# Patient Record
Sex: Male | Born: 1940 | Race: Black or African American | Hispanic: No | Marital: Married | Smoking: Former smoker
Health system: Southern US, Community
[De-identification: ages and names within clinical notes are randomized; demographics above are authoritative.]

## PROBLEM LIST (undated history)

## (undated) DIAGNOSIS — C419 Malignant neoplasm of bone and articular cartilage, unspecified: Secondary | ICD-10-CM

## (undated) DIAGNOSIS — H409 Unspecified glaucoma: Secondary | ICD-10-CM

## (undated) DIAGNOSIS — C801 Malignant (primary) neoplasm, unspecified: Secondary | ICD-10-CM

## (undated) DIAGNOSIS — I2510 Atherosclerotic heart disease of native coronary artery without angina pectoris: Secondary | ICD-10-CM

## (undated) DIAGNOSIS — C3490 Malignant neoplasm of unspecified part of unspecified bronchus or lung: Secondary | ICD-10-CM

## (undated) DIAGNOSIS — E785 Hyperlipidemia, unspecified: Secondary | ICD-10-CM

## (undated) HISTORY — PX: NO PAST SURGERIES: SHX2092

## (undated) HISTORY — DX: Hyperlipidemia, unspecified: E78.5

## (undated) HISTORY — DX: Atherosclerotic heart disease of native coronary artery without angina pectoris: I25.10

## (undated) HISTORY — DX: Unspecified glaucoma: H40.9

## (undated) HISTORY — DX: Malignant (primary) neoplasm, unspecified: C80.1

---

## 2014-01-10 ENCOUNTER — Ambulatory Visit: Payer: Self-pay | Admitting: Family Medicine

## 2014-01-10 VITALS — BP 122/80 | HR 55 | Temp 98.6°F | Resp 17 | Ht 66.0 in | Wt 163.0 lb

## 2014-01-10 DIAGNOSIS — Z0000 Encounter for general adult medical examination without abnormal findings: Secondary | ICD-10-CM

## 2014-01-10 NOTE — Progress Notes (Signed)
[facility] [address] [place] [address] [phone]  Date:  01/10/2014   Name:  Nicholas Frey   DOB:  12-19-1940   MRN:  [id number]  PCP:  No primary provider on file.    Chief Complaint: Employment Physical   History of Present Illness:  Nicholas Frey is a 73 y.o. very pleasant male patient who presents with the following:  Here today for a DOT exam.  He is generally very healthy- he has no known medical problems and only takes a baby asprin daily; no other meds  There are no active problems to display for this patient.   History reviewed. No pertinent past medical history.  History reviewed. No pertinent past surgical history.  History  Substance Use Topics  . Smoking status: Current Every Day Smoker -- 0.40 packs/day for 50 years    Types: Cigarettes  . Smokeless tobacco: Not on file  . Alcohol Use: No    History reviewed. No pertinent family history.  No Known Allergies  Medication list has been reviewed and updated.  No current outpatient prescriptions on file prior to visit.   No current facility-administered medications on file prior to visit.    Review of Systems:  As per HPI- otherwise negative. States that his pulse has always been a bit on the slow side "all my life."     Physical Examination: Filed Vitals:   01/10/14 1252  BP: 122/80  Pulse: 55  Temp: 98.6 F (37 C)  Resp: 17   Filed Vitals:   01/10/14 1252  Height: 5\' 6"  (1.676 m)  Weight: 163 lb (73.936 kg)   Body mass index is 26.32 kg/(m^2). Ideal Body Weight: Weight in (lb) to have BMI = 25: 154.6  GEN: WDWN, NAD, Non-toxic, A & O x 3 HEENT: Atraumatic, Normocephalic. Neck supple. No masses, No LAD.  Bilateral TM wnl, oropharynx normal.  PEERL,EOMI.  Ears and Nose: No external deformity. CV: RRR, No M/G/R. No JVD. No thrill. No extra heart sounds. PULM: CTA B, no wheezes, crackles, rhonchi. No retractions. No resp. distress. No accessory muscle  use. ABD: S, NT, ND. No rebound. No HSM. EXTR: No c/c/e NEURO Normal gait. Normal strength and DTR all extremities  PSYCH: Normally interactive. Conversant. Not depressed or anxious appearing.  Calm demeanor.  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  He is extremely healthy for his age.   Signed Lamar Blinks, MD

## 2014-01-10 NOTE — Patient Instructions (Signed)
You qualify for a 2 year DOT card- take care!

## 2014-02-15 ENCOUNTER — Emergency Department (HOSPITAL_BASED_OUTPATIENT_CLINIC_OR_DEPARTMENT_OTHER)
Admission: EM | Admit: 2014-02-15 | Discharge: 2014-02-15 | Disposition: A | Discharge status: Home / Self Care | Payer: No Typology Code available for payment source | Attending: Emergency Medicine | Admitting: Emergency Medicine

## 2014-02-15 ENCOUNTER — Encounter (HOSPITAL_BASED_OUTPATIENT_CLINIC_OR_DEPARTMENT_OTHER): Payer: Self-pay | Admitting: Emergency Medicine

## 2014-02-15 DIAGNOSIS — M542 Cervicalgia: Secondary | ICD-10-CM

## 2014-02-15 DIAGNOSIS — S199XXA Unspecified injury of neck, initial encounter: Principal | ICD-10-CM

## 2014-02-15 DIAGNOSIS — S0993XA Unspecified injury of face, initial encounter: Secondary | ICD-10-CM | POA: Insufficient documentation

## 2014-02-15 DIAGNOSIS — I498 Other specified cardiac arrhythmias: Secondary | ICD-10-CM | POA: Insufficient documentation

## 2014-02-15 DIAGNOSIS — Z7982 Long term (current) use of aspirin: Secondary | ICD-10-CM | POA: Insufficient documentation

## 2014-02-15 DIAGNOSIS — Y92410 Unspecified street and highway as the place of occurrence of the external cause: Secondary | ICD-10-CM | POA: Insufficient documentation

## 2014-02-15 DIAGNOSIS — F17200 Nicotine dependence, unspecified, uncomplicated: Secondary | ICD-10-CM | POA: Insufficient documentation

## 2014-02-15 DIAGNOSIS — Y9389 Activity, other specified: Secondary | ICD-10-CM | POA: Insufficient documentation

## 2014-02-15 MED ORDER — CYCLOBENZAPRINE HCL 7.5 MG PO TABS: 7.5000 mg | ORAL_TABLET | Freq: Three times a day (TID) | ORAL | Status: DC | PRN
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
## 2014-02-15 NOTE — Discharge Instructions (Signed)
If you were given medicines take as directed.  If you are on coumadin or contraceptives realize their levels and effectiveness is altered by many different medicines.  If you have any reaction (rash, tongues swelling, other) to the medicines stop taking and see a physician.   Please follow up as directed and return to the ER or see a physician for new or worsening symptoms (weakness, numbness, other).  Thank you. Filed Vitals:   02/15/14 1310  BP: 161/83  Pulse: 52  Temp: 98.2 F (36.8 C)  TempSrc: Oral  Resp: 14  Height: 5\' 7"  (1.702 m)  Weight: 162 lb (73.483 kg)  SpO2: 100%

## 2014-12-11 ENCOUNTER — Ambulatory Visit: Payer: Medicare Other

## 2015-04-09 ENCOUNTER — Ambulatory Visit (INDEPENDENT_AMBULATORY_CARE_PROVIDER_SITE_OTHER): Payer: Worker's Compensation | Admitting: Emergency Medicine

## 2015-04-09 ENCOUNTER — Ambulatory Visit: Payer: Worker's Compensation

## 2015-04-09 VITALS — BP 124/70 | HR 50 | Temp 97.7°F | Resp 14 | Ht 66.0 in | Wt 167.4 lb

## 2015-04-09 DIAGNOSIS — M25511 Pain in right shoulder: Secondary | ICD-10-CM

## 2015-04-09 MED ORDER — NAPROXEN SODIUM 550 MG PO TABS: 550.0000 mg | ORAL_TABLET | Freq: Two times a day (BID) | ORAL | Status: DC

## 2015-04-09 NOTE — Progress Notes (Addendum)
Nicholas Frey 1941/01/01 74 y.o.   Chief Complaint  Patient presents with  . Shoulder Injury    Right Shoulder, Workers Comp     Date of Injury: 5.4.16   History of Present Illness:  Presents for evaluation of work-related complaint.  Says while ascending a stairway he slipped and nearly fell He caught himself by his right arm and immediately had pain in the right shoulder. No radiation Pain increases when ABDucts arm No swelling or bruising No improvement with over the counter medications or other home remedies. Denies other complaint or health concern today.   ROS  Review of Systems  Constitutional: Negative for fever, chills and fatigue.  HENT: Negative for congestion, ear pain, hearing loss, postnasal drip, rhinorrhea and sinus pressure.   Eyes: Negative for discharge and redness.  Respiratory: Negative for cough, shortness of breath and wheezing.   Cardiovascular: Negative for chest pain and leg swelling.  Gastrointestinal: Negative for nausea, vomiting, abdominal pain, constipation and blood in stool.  Genitourinary: Negative for dysuria, urgency and frequency.  Musculoskeletal: Negative for neck stiffness.  Skin: Negative for rash.  Neurological: Negative for seizures, weakness and headaches.     No Known Allergies   Current medications reviewed and updated. Past medical history, family history, social history have been reviewed and updated.   Physical Exam  GEN: WDWN, NAD, Non-toxic, A & O x 3 HEENT: Atraumatic, Normocephalic. Neck supple. No masses, No LAD. Ears and Nose: No external deformity. CV: RRR, No M/G/R. No JVD. No thrill. No extra heart sounds. PULM: CTA B, no wheezes, crackles, rhonchi. No retractions. No resp. distress. No accessory muscle use. ABD: S, NT, ND, +BS. No rebound. No HSM. EXTR: No c/c/e NEURO Normal gait.  PSYCH: Normally interactive. Conversant. Not depressed or anxious appearing.  Calm demeanor.  RIGHT shoulder full PROM.  Cannot ABDuct without pain.  No effusion or ecchymosis Assessment and Plan: Shoulder strain Must exclude rotator cuff   Anaprox Sling  Follow up in one week  [facility] reading (PRIMARY) by  Dr. Ouida Sills.  negative.

## 2015-04-09 NOTE — Patient Instructions (Signed)

## 2015-04-16 ENCOUNTER — Ambulatory Visit (INDEPENDENT_AMBULATORY_CARE_PROVIDER_SITE_OTHER): Payer: Worker's Compensation | Admitting: Family Medicine

## 2015-04-16 VITALS — BP 110/68 | HR 50 | Temp 97.8°F | Resp 18 | Ht 66.0 in | Wt 167.0 lb

## 2015-04-16 DIAGNOSIS — M25511 Pain in right shoulder: Secondary | ICD-10-CM

## 2015-04-16 DIAGNOSIS — S46011D Strain of muscle(s) and tendon(s) of the rotator cuff of right shoulder, subsequent encounter: Secondary | ICD-10-CM

## 2015-04-16 NOTE — Progress Notes (Signed)
Subjective:   This chart was scribed for Nicholas Ray, MD by Nicholas Frey, Medical Scribe. This patient was seen in Room 7 and the patient's care was started at 11:59 AM.   Patient ID: Nicholas Frey, male    DOB: 05/18/1941, 74 y.o.   MRN: [id number]  Chief Complaint  Patient presents with  . Follow-up    rt shoulder     HPI HPI Comments: Nicholas Frey is a 74 y.o. male who presents to the [facility] for a f/u of his right shoulder injury that occurred 1 week ago. Pt was seen by Nicholas Frey 1 week ago after the injury at his work. He was walking the stairway slipped and caught himself by right arm. Pt felt immediate pain in right shoulder after the injury. He was x-rayed which showed no acute fracture or dislocation. He was treated with shoulder/arm sling, anaprox medication and placed on lifting restrictions as well as right arm restrictions for 1 week. Pt reports at this visit the pain is 50% better but still experiencing pain. He notes specific pain with abduction of the shoulder but denies pain with other movements of the shoulder. Pt is left hand dominant. He notes mild relief with the medication and is taking 2x daily. Pt denies any abdominal pain, nausea, vomiting, or diarrhea from the medication. He denies any other complaints at this time.   PCP: No primary care provider on file.   There are no active problems to display for this patient.  Past Medical History  Diagnosis Date  . Hyperlipidemia    History reviewed. No pertinent past surgical history. No Known Allergies Prior to Admission medications   Medication Sig Start Date End Date Taking? Authorizing Provider  aspirin EC 81 MG tablet Take 81 mg by mouth daily.   Yes Historical Provider, MD  Atorvastatin Calcium (LIPITOR PO) Take by mouth.   Yes Historical Provider, MD  cyclobenzaprine (FEXMID) 7.5 MG tablet Take 1 tablet (7.5 mg total) by mouth 3 (three) times daily as needed for muscle spasms.  02/15/14  Yes Nicholas Morrison, MD  naproxen sodium (ANAPROX DS) 550 MG tablet Take 1 tablet (550 mg total) by mouth 2 (two) times daily with a meal. 04/09/15 04/08/16 Yes Nicholas Culver, MD   History   Social History  . Marital Status: Married    Spouse Name: N/A  . Number of Children: N/A  . Years of Education: N/A   Occupational History  . Not on file.   Social History Main Topics  . Smoking status: Current Every Day Smoker -- 0.40 packs/day for 50 years    Types: Cigarettes  . Smokeless tobacco: Not on file  . Alcohol Use: No  . Drug Use: No  . Sexual Activity: No   Other Topics Concern  . Not on file   Social History Narrative     Review of Systems  Constitutional: Negative for fever and chills.  Gastrointestinal: Negative for nausea, vomiting, abdominal pain and diarrhea.  Musculoskeletal: Positive for arthralgias. Negative for joint swelling.       Objective:   Physical Exam  Constitutional: He is oriented to person, place, and time. He appears well-developed and well-nourished. No distress.  HENT:  Head: Normocephalic and atraumatic.  Eyes: Conjunctivae and EOM are normal.  Neck: Neck supple. No tracheal deviation present.  Cardiovascular: Normal rate.   Pulmonary/Chest: Effort normal. No respiratory distress.  Musculoskeletal: Normal range of motion.  RIGHT SHOULDER: He has full abduction.  Full ROM but has some discomfort with initial abduction. Slightly weak and discomfort with lift off testing on the right. Resisted internal and external rotation. strength is intact. Pain slightly weak with empty can testing. Negative drop arm test. Negative neer, positive Hawkins. Painful O'briens but no apparent weakness.  BACK: Full ROM of cervical spine and no focal tenderness  Neurological: He is alert and oriented to person, place, and time.  Skin: Skin is warm and dry.  Psychiatric: He has a normal mood and affect. His behavior is normal.  Nursing note and vitals  reviewed.    Filed Vitals:   04/16/15 1137  BP: 110/68  Pulse: 50  Temp: 97.8 F (36.6 C)  TempSrc: Oral  Resp: 18  Height: '5\' 6"'$  (1.676 m)  Weight: 167 lb (75.751 kg)  SpO2: 97%       Assessment & Plan:  Nicholas Frey is a 74 y.o. male Pain in joint, shoulder region, right  Rotator cuff strain, right, subsequent encounter  R shoulder pain due to injury at work - DOI 03/31/15.  Suspected rotator cuff strain, with 50% improvement. Possible supraspinatus strain by exam, but FROM.    -out of sling, ok to drive as no pain or weakness with typical use required for driving (but advised no to drive if pain or weakness in doing so).   -anaprox if needed with food - wean as less need.   -ROM exercises, work restrictions by letter, and recheck in 1 week - sooner if worse.   No orders of the defined types were placed in this encounter.   Patient Instructions  Naprosyn if needed with food. See work restrictions. Follow up in 1 week. If any difficulty in operating vehicle, do not drive.  Return to the clinic or go to the nearest emergency room if any of your symptoms worsen or new symptoms occur.

## 2015-04-16 NOTE — Patient Instructions (Signed)
Naprosyn if needed with food. See work restrictions. Follow up in 1 week. If any difficulty in operating vehicle, do not drive.  Return to the clinic or go to the nearest emergency room if any of your symptoms worsen or new symptoms occur.

## 2015-07-07 ENCOUNTER — Ambulatory Visit (INDEPENDENT_AMBULATORY_CARE_PROVIDER_SITE_OTHER): Payer: Worker's Compensation | Admitting: Emergency Medicine

## 2015-07-07 VITALS — BP 104/68 | HR 98 | Temp 98.0°F | Resp 14 | Ht 66.0 in | Wt 165.0 lb

## 2015-07-07 DIAGNOSIS — M25511 Pain in right shoulder: Secondary | ICD-10-CM | POA: Diagnosis not present

## 2015-07-07 MED ORDER — NAPROXEN SODIUM 550 MG PO TABS: 550.0000 mg | ORAL_TABLET | Freq: Two times a day (BID) | ORAL | Status: DC

## 2015-07-07 NOTE — Patient Instructions (Signed)
Rotator Cuff Injury Rotator cuff injury is any type of injury to the set of muscles and tendons that make up the stabilizing unit of your shoulder. This unit holds the ball of your upper arm bone (humerus) in the socket of your shoulder blade (scapula).  CAUSES Injuries to your rotator cuff most commonly come from sports or activities that cause your arm to be moved repeatedly over your head. Examples of this include throwing, weight lifting, swimming, or racquet sports. Long lasting (chronic) irritation of your rotator cuff can cause soreness and swelling (inflammation), bursitis, and eventual damage to your tendons, such as a tear (rupture). SIGNS AND SYMPTOMS Acute rotator cuff tear:  Sudden tearing sensation followed by severe pain shooting from your upper shoulder down your arm toward your elbow.  Decreased range of motion of your shoulder because of pain and muscle spasm.  Severe pain.  Inability to raise your arm out to the side because of pain and loss of muscle power (large tears). Chronic rotator cuff tear:  Pain that usually is worse at night and may interfere with sleep.  Gradual weakness and decreased shoulder motion as the pain worsens.  Decreased range of motion. Rotator cuff tendinitis:  Deep ache in your shoulder and the outside upper arm over your shoulder.  Pain that comes on gradually and becomes worse when lifting your arm to the side or turning it inward. DIAGNOSIS Rotator cuff injury is diagnosed through a medical history, physical exam, and imaging exam. The medical history helps determine the type of rotator cuff injury. Your health care provider will look at your injured shoulder, feel the injured area, and ask you to move your shoulder in different positions. X-ray exams typically are done to rule out other causes of shoulder pain, such as fractures. MRI is the exam of choice for the most severe shoulder injuries because the images show muscles and tendons.    TREATMENT  Chronic tear:  Medicine for pain, such as acetaminophen or ibuprofen.  Physical therapy and range-of-motion exercises may be helpful in maintaining shoulder function and strength.  Steroid injections into your shoulder joint.  Surgical repair of the rotator cuff if the injury does not heal with noninvasive treatment. Acute tear:  Anti-inflammatory medicines such as ibuprofen and naproxen to help reduce pain and swelling.  A sling to help support your arm and rest your rotator cuff muscles. Long-term use of a sling is not advised. It may cause significant stiffening of the shoulder joint.  Surgery may be considered within a few weeks, especially in younger, active people, to return the shoulder to full function.  Indications for surgical treatment include the following:  Age younger than 62 years.  Rotator cuff tears that are complete.  Physical therapy, rest, and anti-inflammatory medicines have been used for 6-8 weeks, with no improvement.  Employment or sporting activity that requires constant shoulder use. Tendinitis:  Anti-inflammatory medicines such as ibuprofen and naproxen to help reduce pain and swelling.  A sling to help support your arm and rest your rotator cuff muscles. Long-term use of a sling is not advised. It may cause significant stiffening of the shoulder joint.  Severe tendinitis may require:  Steroid injections into your shoulder joint.  Physical therapy.  Surgery. HOME CARE INSTRUCTIONS   Apply ice to your injury:  Put ice in a plastic bag.  Place a towel between your skin and the bag.  Leave the ice on for 20 minutes, 2-3 times a day.  If you  have a shoulder immobilizer (sling and straps), wear it until told otherwise by your health care provider.  You may want to sleep on several pillows or in a recliner at night to lessen swelling and pain.  Only take over-the-counter or prescription medicines for pain, discomfort, or fever as  directed by your health care provider.  Do simple hand squeezing exercises with a soft rubber ball to decrease hand swelling. SEEK MEDICAL CARE IF:   Your shoulder pain increases, or new pain or numbness develops in your arm, hand, or fingers.  Your hand or fingers are colder than your other hand. SEEK IMMEDIATE MEDICAL CARE IF:   Your arm, hand, or fingers are numb or tingling.  Your arm, hand, or fingers are increasingly swollen and painful, or they turn white or blue. MAKE SURE YOU:  Understand these instructions.  Will watch your condition.  Will get help right away if you are not doing well or get worse. Document Released: 11/10/2000 Document Revised: 11/18/2013 Document Reviewed: 06/25/2013 [facility] [employer] Patient Information 2015 [place], [place]. This information is not intended to replace advice given to you by your health care provider. Make sure you discuss any questions you have with your health care provider.

## 2015-07-07 NOTE — Progress Notes (Signed)
Subjective:  Patient ID: Nicholas Frey, male    DOB: Apr 02, 1941  Age: 74 y.o. MRN: [id number]  CC: Follow-up   HPI Nicholas Frey presents  with continued left shoulder pain. He was seen back in May several times for shoulder pain he never followed up he said that he did have improvement in his shoulder pain medication and it never quite went away. 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 And that's aggravated his right shoulder pain. He is still low longer raise his arm over his head and has pain lifting and carrying things. He has no repeat injury  History Nicholas Frey has a past medical history of Hyperlipidemia.   He has no past surgical history on file.   His  family history is not on file.  He   reports that he has been smoking Cigarettes.  He has a 20 pack-year smoking history. He does not have any smokeless tobacco history on file. He reports that he does not drink alcohol or use illicit drugs.  Outpatient Prescriptions Prior to Visit  Medication Sig Dispense Refill  . aspirin EC 81 MG tablet Take 81 mg by mouth daily.    . Atorvastatin Calcium (LIPITOR PO) Take by mouth.    . cyclobenzaprine (FEXMID) 7.5 MG tablet Take 1 tablet (7.5 mg total) by mouth 3 (three) times daily as needed for muscle spasms. 8 tablet 0  . naproxen sodium (ANAPROX DS) 550 MG tablet Take 1 tablet (550 mg total) by mouth 2 (two) times daily with a meal. 40 tablet 0   No facility-administered medications prior to visit.    Social History   Social History  . Marital Status: Married    Spouse Name: N/A  . Number of Children: N/A  . Years of Education: N/A   Social History Main Topics  . Smoking status: Current Every Day Smoker -- 0.40 packs/day for 50 years    Types: Cigarettes  . Smokeless tobacco: None  . Alcohol Use: No  . Drug Use: No  . Sexual Activity: No   Other Topics Concern  . None   Social History Narrative     Review of Systems  Constitutional: Negative for fever, chills  and appetite change.  HENT: Negative for congestion, ear pain, postnasal drip, sinus pressure and sore throat.   Eyes: Negative for pain and redness.  Respiratory: Negative for cough, shortness of breath and wheezing.   Cardiovascular: Negative for leg swelling.  Gastrointestinal: Negative for nausea, vomiting, abdominal pain, diarrhea, constipation and blood in stool.  Endocrine: Negative for polyuria.  Genitourinary: Negative for dysuria, urgency, frequency and flank pain.  Musculoskeletal: Negative for gait problem.  Skin: Negative for rash.  Neurological: Negative for weakness and headaches.  Psychiatric/Behavioral: Negative for confusion and decreased concentration. The patient is not nervous/anxious.     Objective:  BP 104/68 mmHg  Pulse 98  Temp(Src) 98 F (36.7 C) (Oral)  Resp 14  Ht '5\' 6"'$  (1.676 m)  Wt 165 lb (74.844 kg)  BMI 26.64 kg/m2  SpO2 96%  Physical Exam  Constitutional: He is oriented to person, place, and time. He appears well-developed and well-nourished.  HENT:  Head: Normocephalic and atraumatic.  Eyes: Conjunctivae are normal. Pupils are equal, round, and reactive to light.  Pulmonary/Chest: Effort normal.  Musculoskeletal: He exhibits no edema.  Neurological: He is alert and oriented to person, place, and time.  Skin: Skin is dry.  Psychiatric: He has a normal mood and affect. His behavior is  normal. Thought content normal.   He has marked tenderness in his right lateral shoulder he has marked pain with abduction against resistance. There is no crepitus or ecchymosis or swelling   Assessment & Plan:   Nicholas Frey was seen today for follow-up.  Diagnoses and all orders for this visit:  Pain in joint, shoulder region, right -     MR Shoulder Right Wo Contrast; Future  Other orders -     naproxen sodium (ANAPROX DS) 550 MG tablet; Take 1 tablet (550 mg total) by mouth 2 (two) times daily with a meal.   I am having Nicholas Frey maintain his aspirin EC,  cyclobenzaprine, Atorvastatin Calcium (LIPITOR PO), and naproxen sodium.  Meds ordered this encounter  Medications  . naproxen sodium (ANAPROX DS) 550 MG tablet    Sig: Take 1 tablet (550 mg total) by mouth 2 (two) times daily with a meal.    Dispense:  40 tablet    Refill:  0    Appropriate red flag conditions were discussed with the patient as well as actions that should be taken.  Patient expressed his understanding.  Follow-up: Return in about 1 week (around 07/14/2015).  Roselee Culver, MD

## 2015-07-17 ENCOUNTER — Ambulatory Visit
Admission: RE | Admit: 2015-07-17 | Discharge: 2015-07-17 | Disposition: A | Discharge status: Home / Self Care | Payer: Self-pay | Source: Ambulatory Visit | Attending: Emergency Medicine | Admitting: Emergency Medicine

## 2015-07-17 DIAGNOSIS — M25511 Pain in right shoulder: Secondary | ICD-10-CM

## 2015-07-18 ENCOUNTER — Other Ambulatory Visit: Payer: Self-pay | Admitting: Emergency Medicine

## 2015-07-18 DIAGNOSIS — R2230 Localized swelling, mass and lump, unspecified upper limb: Secondary | ICD-10-CM

## 2015-07-19 ENCOUNTER — Telehealth: Payer: Self-pay | Admitting: Family Medicine

## 2015-07-19 NOTE — Telephone Encounter (Signed)
Received call report of MRI R Shoulder. See full report.    IMPRESSION: Lesion in the lateral aspect of the spine of the scapula extending into the acromion has an appearance most consistent with neoplastic process such as metastatic disease or less likely multiple myeloma. Biopsy is recommended for further evaluation.  Mild rotator cuff tendinopathy without tear.  Mild acromioclavicular osteoarthritis.

## 2015-08-03 ENCOUNTER — Ambulatory Visit (INDEPENDENT_AMBULATORY_CARE_PROVIDER_SITE_OTHER): Payer: Worker's Compensation | Admitting: Family Medicine

## 2015-08-03 VITALS — BP 142/78 | HR 61 | Temp 98.2°F | Resp 18 | Ht 66.0 in | Wt 164.0 lb

## 2015-08-03 DIAGNOSIS — M7581 Other shoulder lesions, right shoulder: Secondary | ICD-10-CM | POA: Diagnosis not present

## 2015-08-03 DIAGNOSIS — R937 Abnormal findings on diagnostic imaging of other parts of musculoskeletal system: Secondary | ICD-10-CM

## 2015-08-03 MED ORDER — HYDROCODONE-ACETAMINOPHEN 5-325 MG PO TABS: 1.0000 | ORAL_TABLET | ORAL | Status: DC | PRN

## 2015-08-03 NOTE — Progress Notes (Addendum)
Subjective:  This chart was scribed for Nicholas Cheadle MD, by Tamsen Roers, at [facility].  This patient was seen in room 4 and the patient's care was started at 9:08 AM.   Chief Complaint  Patient presents with  . Follow-up  . Shoulder Injury    rt     Patient ID: Nicholas Frey, male    DOB: [birth year], 74 y.o.   MRN: [id number]  HPI  HPI Comments: Nicholas Frey is a 74 y.o. male who presents to the [facility] for a follow up for right sided increasing shoulder pain. He states that the pain medication he received from his PCP is "not working".  Patient states that he has not have had any information for a biopsy from anyone and was told that a biopsy was needed after he had his MRI.  Patient's PCP is Dr.  Drake Leach (last saw him 3 weeks ago).  He has not been working due to his shoulder injury.   ------ Mr Nicholas Frey slipped down some stairs caught himself with right shoulder with immediate pain may 2016, he was seen right after injury and was diagnosed with strain started on anaprox and placed in sling.  After an x ray, he showed no acute abnormality but possible remote injury to humoral head. Symptoms gradually improved and so patient never followed up however they never fully resolved and when he does heavy lifting, it is exasperated and he can no longer abduct his shoulder above 90 degrees so he was sent for an MRI two weeks ago which showed a lesion in the spine of the scapula into acrominn consistent with a neoplastic process concern for metastatic disease or multiple myeloma, biopsy was recommended.  He also had mild arthritis and mild rotator cuff tendonitis.      Past Medical History  Diagnosis Date  . Hyperlipidemia      Current Outpatient Prescriptions on File Prior to Visit  Medication Sig Dispense Refill  . aspirin EC 81 MG tablet Take 81 mg by mouth daily.    . Atorvastatin Calcium (LIPITOR PO) Take by mouth.    . cyclobenzaprine  (FEXMID) 7.5 MG tablet Take 1 tablet (7.5 mg total) by mouth 3 (three) times daily as needed for muscle spasms. 8 tablet 0  . naproxen sodium (ANAPROX DS) 550 MG tablet Take 1 tablet (550 mg total) by mouth 2 (two) times daily with a meal. (Patient not taking: Reported on 08/03/2015) 40 tablet 0   No current facility-administered medications on file prior to visit.     No Known Allergies   Review of Systems  Constitutional: Negative for fever and chills.  Gastrointestinal: Negative for nausea and vomiting.  Musculoskeletal: Positive for myalgias and arthralgias.       Objective:   Physical Exam  Constitutional: He is oriented to person, place, and time. He appears well-developed and well-nourished. No distress.  HENT:  Head: Normocephalic and atraumatic.  Eyes: Pupils are equal, round, and reactive to light.  Pulmonary/Chest: Effort normal. No respiratory distress.  Neurological: He is alert and oriented to person, place, and time.  Skin: Skin is warm and dry.  Psychiatric: He has a normal mood and affect. His behavior is normal.  Nursing note and vitals reviewed.   Filed Vitals:   08/03/15 0828  BP: 142/78  Pulse: 61  Temp: 98.2 F (36.8 C)  TempSrc: Oral  Resp: 18  Height: '5\' 6"'  (1.676 m)  Weight: 164 lb (  74.39 kg)  SpO2: 97%       Assessment & Plan:   1. Right rotator cuff tendinitis   2. Abnormal MRI scan, bone   Discussed case with pt's PCP Dr. [name] in [facility] - recommend biopsy asap - Dr. [name] will be able to work pt into  Clinic asap and order this so he can f/u and coordinate pt's care.  Our office scheduled a visit for him tomorrow there - faxed info and pt will pick up disc of MRI. Suspect that the underling bone lesion made the rotator cuff dysfunction and pain worse so ortho referral was placed and referrals is still pursuing this.   Pt should be out of work until after biopsy results and then can go forward.  Pt does not use pain medicine but sxs  are continuing to worsen so encouraged to try hydrocodone qhs   Meds ordered this encounter  Medications  . HYDROcodone-acetaminophen (NORCO/VICODIN) 5-325 MG per tablet    Sig: Take 1-2 tablets by mouth every 4 (four) hours as needed for moderate pain.    Dispense:  30 tablet    Refill:  0   Over 20 min spent in face-to-face evaluation of and consultation with patient and coordination of care.    I personally performed the services described in this documentation, which was scribed in my presence. The recorded information has been reviewed and considered, and addended by me as needed.  Nicholas Cheadle, MD MPH

## 2015-08-03 NOTE — Patient Instructions (Addendum)
Appt with Dr. [name] at 11:45 9/7 Bone Biopsy, Needle A bone biopsy is a minor surgical procedure in which a small sample of bone is removed. The sample is taken with a needle. The bone sample is then looked at under a microscope by a specialist. The sample is usually taken from a bone close to the skin. This test is often done to identify a problem found on X-rays or with blood tests. It may be used to make sure something seen in the bone is not cancer or another problem that needs treatment. It can help diagnose problems such as:  A tumor of the bone marrow (multiple myeloma).  Bone that forms abnormally (Paget's disease).  Non-cancerous (benign) bone cysts.  Bony growths.  Infections in the bone.  Other bone marrow conditions. LET YOUR CAREGIVER KNOW ABOUT:  Previous problems with anesthetics or medicines used to numb the skin.  Allergies to dyes, iodine, foods, or latex.  Medicines taken including herbs, eye drops, prescription medicines (especially medicines used to "thin the blood"), aspirin, other over-the-counter medicines, and steroids (by mouth or as a cream).  History of bleeding or blood problems.  Possibility of pregnancy, if this applies.  History of blood clots in your legs or lungs.  Previous surgery.  Other important health problems. RISKS AND COMPLICATIONS All surgery is associated with risks. Some of these risks are:  Excessive bleeding.  Infection.  Injury to surrounding tissue. BEFORE THE PROCEDURE  Ask your caregiver how long to withhold aspirin or blood thinners prior to having the procedure.  Do not eat or drink anything after midnight the night before the biopsy.  Let your caregiver know if you develop a cold or other infectious problem prior to surgery.  You should arrive 60 minutes prior to your procedure or as directed. PROCEDURE   The sample of bone is removed by putting a large needle through the skin and into the bone.  Bone biopsies  are frequently done under local anesthesia. Sometimes sedatives are also given to keep you relaxed or even asleep during the procedure. Finding out the results of your test Not all test results are available during your visit. If your test results are not back during the visit, make an appointment with your caregiver to find out the results. Do not assume everything is normal if you have not heard from your caregiver or the medical facility. It is important for you to follow up on all of your test results.  SEEK MEDICAL CARE IF:   You have redness, swelling, or increasing pain in the biopsy site.  You have pus coming from the biopsy site.  You have drainage from the site lasting longer than 1 to 2 days.  You notice a bad smell coming from the biopsy site or dressing.  You have a breaking open of the site (edges not staying together) after sutures have been removed.  You develop persistent nausea or vomiting. SEEK IMMEDIATE MEDICAL CARE IF:   You have a fever.  You develop a rash.  You have difficulty breathing.  You develop any reaction or side effects to medicines given. Document Released: 09/21/2004 Document Revised: 02/05/2012 Document Reviewed: 04/21/2009 [employer] Patient Information 2015 [place], [place]. This information is not intended to replace advice given to you by your health care provider. Make sure you discuss any questions you have with your health care provider.

## 2015-09-10 DIAGNOSIS — C7951 Secondary malignant neoplasm of bone: Secondary | ICD-10-CM | POA: Insufficient documentation

## 2015-09-10 DIAGNOSIS — M19019 Primary osteoarthritis, unspecified shoulder: Secondary | ICD-10-CM | POA: Insufficient documentation

## 2016-01-17 ENCOUNTER — Telehealth: Payer: Self-pay | Admitting: Physician Assistant

## 2016-01-17 ENCOUNTER — Ambulatory Visit (INDEPENDENT_AMBULATORY_CARE_PROVIDER_SITE_OTHER): Payer: Self-pay | Admitting: Emergency Medicine

## 2016-01-17 VITALS — BP 160/92 | HR 71 | Temp 97.9°F | Resp 16 | Ht 66.0 in | Wt 164.0 lb

## 2016-01-17 DIAGNOSIS — I4510 Unspecified right bundle-branch block: Secondary | ICD-10-CM | POA: Insufficient documentation

## 2016-01-17 DIAGNOSIS — Z021 Encounter for pre-employment examination: Secondary | ICD-10-CM

## 2016-01-17 DIAGNOSIS — I2510 Atherosclerotic heart disease of native coronary artery without angina pectoris: Secondary | ICD-10-CM | POA: Insufficient documentation

## 2016-01-17 DIAGNOSIS — H409 Unspecified glaucoma: Secondary | ICD-10-CM | POA: Insufficient documentation

## 2016-01-17 DIAGNOSIS — I739 Peripheral vascular disease, unspecified: Secondary | ICD-10-CM | POA: Insufficient documentation

## 2016-01-17 DIAGNOSIS — I10 Essential (primary) hypertension: Secondary | ICD-10-CM | POA: Insufficient documentation

## 2016-01-17 DIAGNOSIS — E785 Hyperlipidemia, unspecified: Secondary | ICD-10-CM | POA: Insufficient documentation

## 2016-01-17 DIAGNOSIS — Z024 Encounter for examination for driving license: Secondary | ICD-10-CM

## 2016-01-17 DIAGNOSIS — C801 Malignant (primary) neoplasm, unspecified: Secondary | ICD-10-CM | POA: Insufficient documentation

## 2016-01-17 NOTE — Progress Notes (Signed)
Commercial Driver Medical Examination   Nicholas Frey is a 75 y.o. male who presents today for a commercial driver fitness determination physical exam. The patient reports no problems. However, reconciliation of the EMR reveals diagnoses he did not share:  Patient Active Problem List   Diagnosis Date Noted  . Arteriosclerosis of coronary artery 01/17/2016  . 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 HLD (hyperlipidemia) 01/17/2016  . BP (high blood pressure) 01/17/2016  . Peripheral vascular disease ([place]) 01/17/2016  . Bundle branch block, right 01/17/2016  . Glaucoma   . AC (acromioclavicular) arthritis 09/10/2015  . 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  Review of Systems A comprehensive review of systems was negative except for: Musculoskeletal: positive for arthralgias, muscle weakness and reduced ROM of the RIGHT shoulder   Objective:    Vision:  Uncorrected Corrected Horizontal Field of Vision  Right Eye 20/40 n/a 70 degrees  Left Eye  20/40 n/a 70 degrees  Both Eyes  72/53-6 n/a    Applicant can recognize and distinguish among traffic control signals and devices showing standard red, green, and amber colors.     Monocular Vision?: No   Hearing: Whisper Test: 10 feet bilaterally     BP 160/92 mmHg  Pulse 71  Temp(Src) 97.9 F (36.6 C)  Resp 16  Ht '5\' 6"'$  (1.676 m)  Wt 164 lb (74.39 kg)  BMI 26.48 kg/m2  SpO2 96%  General Appearance:    Alert, cooperative, no distress, appears stated age  Head:    Normocephalic, without obvious abnormality, atraumatic  Eyes:    PERRL, conjunctiva/corneas clear, EOM's intact, fundi    Benign except mild cataracts, both eyes       Ears:    Normal TM's and external ear canals, both  ears  Nose:   Nares normal, septum midline, mucosa normal, no drainage    or sinus tenderness  Throat:   Lips, mucosa, and tongue normal; teeth and gums normal  Neck:   Supple, symmetrical, trachea midline, no adenopathy;       thyroid:  No enlargement/tenderness/nodules; no carotid   bruit or JVD  Back:     Symmetric, no curvature, ROM normal, no CVA tenderness  Lungs:     Clear to auscultation bilaterally, respirations unlabored  Chest wall:    No tenderness or deformity  Heart:    Regular rate and rhythm, S1 and S2 normal, no murmur, rub   or gallop  Abdomen:     Soft, non-tender, bowel sounds active all four quadrants,    no masses, no organomegaly  Genitalia:    Normal male external, no hernia     Extremities:   Extremities normal, atraumatic, no cyanosis or edema  Pulses:   2+ and symmetric all extremities  Skin:   Skin color, texture, turgor normal, no rashes or lesions  Lymph nodes:   Cervical, supraclavicular, and axillary nodes normal  Neurologic:   CNII-XII intact. Normal strength, sensation and reflexes      throughout       Assessment:  Blood pressure 160/92, pulse 71, temperature 97.9 F (36.6 C), resp. rate 16, height '5\' 6"'$  (1.676 m), weight 164 lb (74.39 kg), SpO2 96 %. Body mass index is 26.48 kg/(m^2). Well-developed, well nourished black male who is awake, alert and oriented, in  NAD. HEENT: [place]/AT, PERRL, EOMI.  Sclera and conjunctiva are clear.  Funduscopic reveals , but otherwise normal. EAC are patent, TMs are normal in appearance. Nasal mucosa is pink and moist. OP is clear. Neck: supple, non-tender, no lymphadenopathy, thyromegaly. Heart: RRR, no murmur Lungs: normal effort, CTA Abdomen: normo-active bowel sounds, supple, non-tender, no mass or organomegaly. Extremities: no cyanosis, clubbing or edema. Tenderness with palpation posterior RIGHT shoulder, approximately 2 inches proximal to the distal acromion. ROM of the RIGHT shoulder is MINIMAL. Skin: warm and  dry without rash. Psychologic: good mood and appropriate affect, normal speech and behavior. Neurological: normal DTRs, sensation is intact bilaterally upper and lower extremities, REDUCED strength of the RIGHT shoulder.    Does not meet standards in 49 CFR 391.41.    Plan:    Medical examiners certificate completed. Copy made to scan into record. Certification DENIED. No card given. Discussed case with Dr. Everlene Farrier. He asked what he could do to re-gain certification.  1. Skills Performance Evaluation regarding the RIGHT shoulder. 2. Letter from oncologist regarding status/prognosis of lung cancer (metastatic to RIGHT clavicle), including PaO2 level >20mHg and risk of sudden incapacitation. 3. Letter from PCP regarding cardiovascular status and risk of sudden incapacitation.  BP would also need to be 140/90 or less, and he would need re-certification every 3 months x 2 years, than annually x 5 years.

## 2016-01-17 NOTE — Telephone Encounter (Signed)
Last visit with PCP was 01/06/2016. BP then was 132/78, and is usually controlled there.  Lung cancer with bony mets to RIGHT clavicle noted in their record, but no comments regarding his functional ability or prognosis.  No PaO2 on file in their record.

## 2016-01-17 NOTE — Patient Instructions (Signed)
In order to become certified you need:  1. Skills Performance Evaluation (your employer can help you find the address to send the letter to request this). I have also provided you with information available to me regarding obtaining this evaluation.  2. Letter from your oncologist regarding your lung cancer, including the treatment thus far, planned treatment and prognosis as of the present. Also, your pulmonary function, including PaO2 (must be >82 mmHG for certification), and risk of sudden incapacitation.  3. Letter from your primary care provider regarding your cardiovascular disease status. We see in the record that you have coronary artery disease and carotid artery disease. If that is incorrect, a statement of such is adequate. If that is correct, we need to know the status of it, previous treatment and any planned treatment as of the present, and the risk for sudden incapacitation from it.

## 2016-03-30 ENCOUNTER — Encounter (HOSPITAL_BASED_OUTPATIENT_CLINIC_OR_DEPARTMENT_OTHER): Payer: Self-pay | Admitting: Emergency Medicine

## 2016-03-30 ENCOUNTER — Emergency Department (HOSPITAL_BASED_OUTPATIENT_CLINIC_OR_DEPARTMENT_OTHER)
Admission: EM | Admit: 2016-03-30 | Discharge: 2016-03-30 | Disposition: A | Discharge status: Home / Self Care | Payer: Medicare Other | Attending: Emergency Medicine | Admitting: Emergency Medicine

## 2016-03-30 ENCOUNTER — Emergency Department (HOSPITAL_BASED_OUTPATIENT_CLINIC_OR_DEPARTMENT_OTHER): Payer: Medicare Other

## 2016-03-30 DIAGNOSIS — Z87891 Personal history of nicotine dependence: Secondary | ICD-10-CM | POA: Diagnosis not present

## 2016-03-30 DIAGNOSIS — Z85118 Personal history of other malignant neoplasm of bronchus and lung: Secondary | ICD-10-CM | POA: Insufficient documentation

## 2016-03-30 DIAGNOSIS — Z85830 Personal history of malignant neoplasm of bone: Secondary | ICD-10-CM | POA: Diagnosis not present

## 2016-03-30 DIAGNOSIS — R0781 Pleurodynia: Secondary | ICD-10-CM

## 2016-03-30 DIAGNOSIS — R0789 Other chest pain: Secondary | ICD-10-CM

## 2016-03-30 HISTORY — DX: Malignant neoplasm of unspecified part of unspecified bronchus or lung: C34.90

## 2016-03-30 HISTORY — DX: Malignant neoplasm of bone and articular cartilage, unspecified: C41.9

## 2016-03-30 MED ORDER — HYDROCODONE-ACETAMINOPHEN 5-325 MG PO TABS: 0.5000 | ORAL_TABLET | ORAL | Status: AC | PRN

## 2016-03-30 NOTE — ED Notes (Signed)
Pt reports pain across low right rib area around to back when he coughs x2 days.

## 2016-03-30 NOTE — Discharge Instructions (Signed)

## 2016-03-30 NOTE — ED Provider Notes (Signed)
CSN: [id number]     Arrival date & time 03/30/16  0058 History   First MD Initiated Contact with Patient 03/30/16 0252     Chief Complaint  Patient presents with  . Rib Pain      (Consider location/radiation/quality/duration/timing/severity/associated sxs/prior Treatment) HPI  This is a 75 year old male with known metastatic cancer. He is here with several days of right rib pain. The pain is well localized at about the right anterior axillary line. Pain is minimal at rest but moderate to severe when coughing, taking deep breaths or moving. He is not short of breath at rest. The pain is sharp in nature and sometimes radiates. He denies acute injury.  Past Medical History  Diagnosis Date  . Hyperlipidemia   . Cancer ([place])   . ASCVD (arteriosclerotic cardiovascular disease)   . Glaucoma   . Bone cancer ([place])   . Lung cancer ([place])   . Metastatic lung cancer (metastasis from lung to other site) [facility])    Past Surgical History  Procedure Laterality Date  . No past surgeries     No family history on file. Social History  Substance Use Topics  . Smoking status: Former Smoker -- 0.40 packs/day for 50 years    Types: Cigarettes  . Smokeless tobacco: Never Used  . Alcohol Use: No    Review of Systems  All other systems reviewed and are negative.   Allergies  Review of patient's allergies indicates no known allergies.  Home Medications   Prior to Admission medications   Medication Sig Start Date End Date Taking? Authorizing Provider  aspirin EC 81 MG tablet Take 81 mg by mouth daily.    Historical Provider, MD  Atorvastatin Calcium (LIPITOR PO) Take by mouth.    Historical Provider, MD  folic acid (FOLVITE) 1 MG tablet Take 1 mg by mouth. 09/14/15 09/13/16  Historical Provider, MD  naproxen sodium (ANAPROX DS) 550 MG tablet Take 1 tablet (550 mg total) by mouth 2 (two) times daily with a meal. Patient not taking: Reported on 08/03/2015 07/07/15 07/06/16  Roselee Culver, MD   timolol (BETIMOL) 0.5 % ophthalmic solution 2 (two) times daily.    Historical Provider, MD   BP 156/88 mmHg  Pulse 67  Resp 20  Ht '5\' 7"'$  (1.702 m)  Wt 163 lb (73.936 kg)  BMI 25.52 kg/m2  SpO2 95%   Physical Exam  General: Well-developed, well-nourished male in no acute distress; appearance consistent with age of record HENT: normocephalic; atraumatic Eyes: pupils equal, round and reactive to light; extraocular muscles intact; arcus senilis bilaterally; cataracts bilaterally Neck: supple Heart: regular rate and rhythm; distant sounds Lungs: clear to auscultation bilaterally; distant sounds Chest: point tenderness right rib without deformity or crepitus; Port-A-Cath right upper quadrant Abdomen: soft; nondistended; nontender; bowel sounds present Extremities: No deformity; full range of motion; pulses normal Neurologic: Awake, alert and oriented; motor function intact in all extremities and symmetric; no facial droop Skin: Warm and dry Psychiatric: Normal mood and affect    ED Course  Procedures (including critical care time)   MDM  Nursing notes and vitals signs, including pulse oximetry, reviewed.  Summary of this visit's results, reviewed by myself:  Imaging Studies: Dg Ribs Unilateral W/chest Right  03/30/2016  CLINICAL DATA:  Skeletal metastases. Chest wall pain with coughing for 2 days. EXAM: RIGHT RIBS AND CHEST - 3+ VIEW COMPARISON:  None. FINDINGS: Right rib images are negative for displaced fracture. There is marked thoracolumbar scoliosis. Mild loss of height  is present in several of the lower thoracic vertebrae with biconcave deformities. There is mild patchy and linear opacity in the right base which could represent scarring, atelectasis or consolidation. Lung base mass cannot be excluded. The left lung is clear. No large effusions. There is a right jugular Port-A-Cath with tip at the expected location of the low SVC. No prior studies are available this time.  IMPRESSION: Patchy opacities in the right lung base. Cannot exclude infectious infiltrate. No displaced fracture. Electronically Signed   By: Andreas Newport M.D.   On: 03/30/2016 03:44       Shanon Rosser, MD 03/30/16 [phone]

## 2016-03-30 NOTE — ED Notes (Signed)
MD at bedside. 

## 2016-10-27 DEATH — deceased

## 2017-06-02 IMAGING — DX DG RIBS W/ CHEST 3+V*R*
3 series · 3 of 3 positions shown · non-contrast
Comparison: None.

CLINICAL DATA: Skeletal metastases. Chest wall pain with coughing
for 2 days.

EXAM:
RIGHT RIBS AND CHEST - 3+ VIEW

[chest pa]
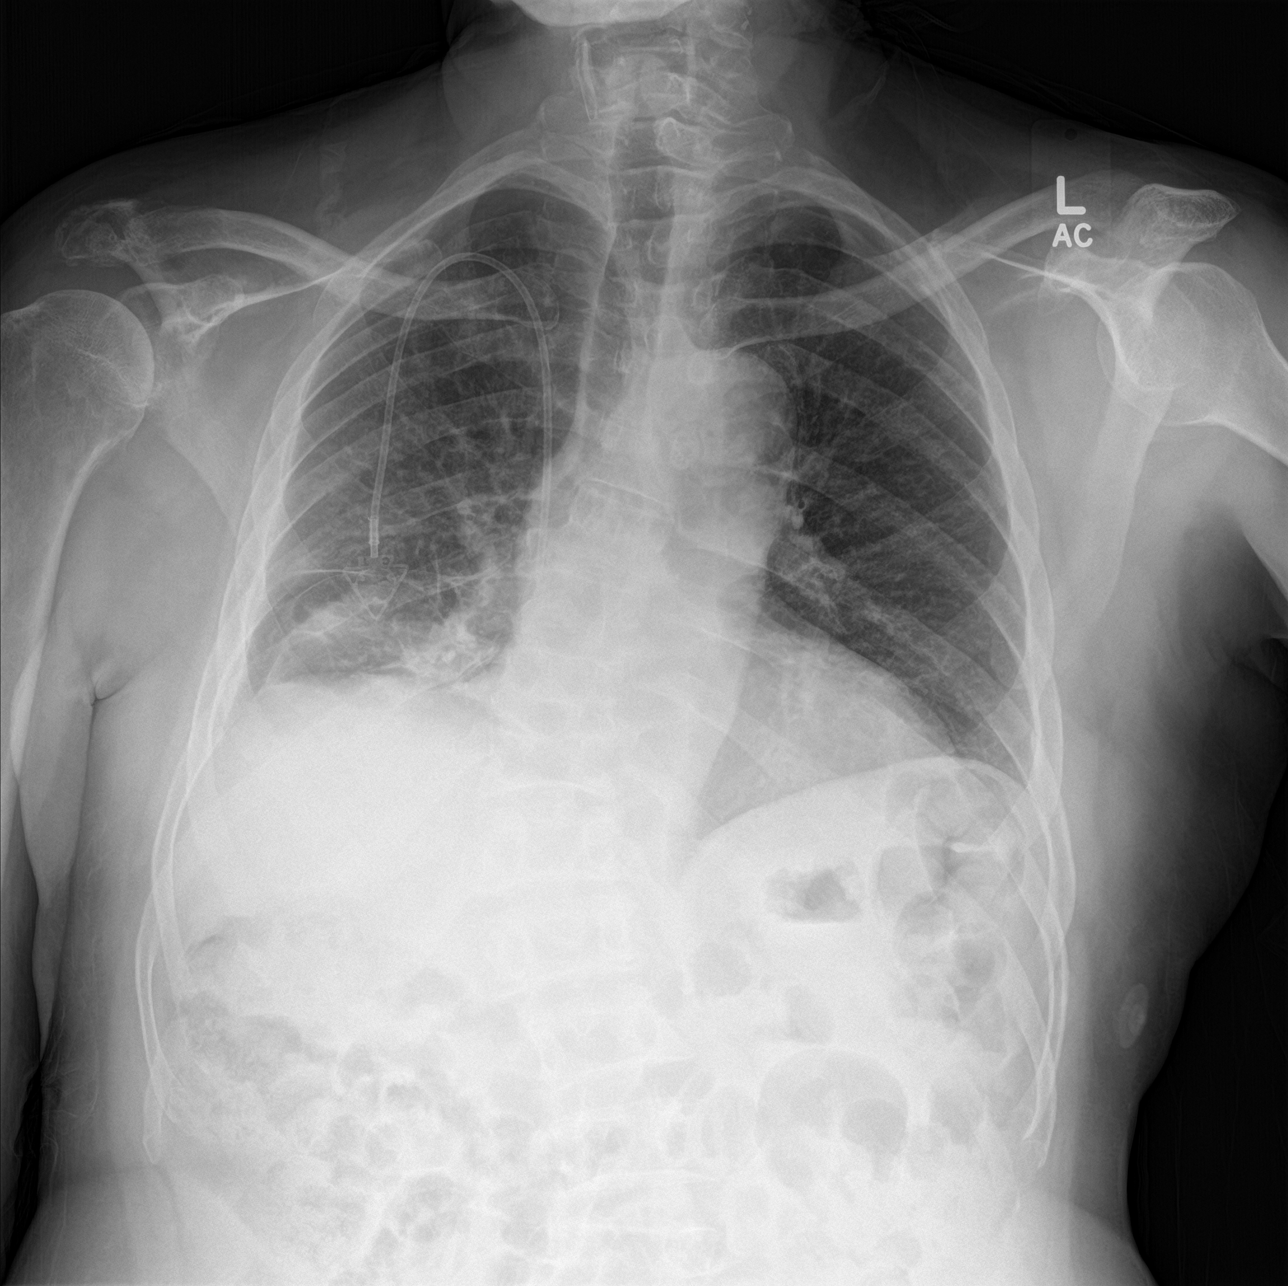

[rib pa]
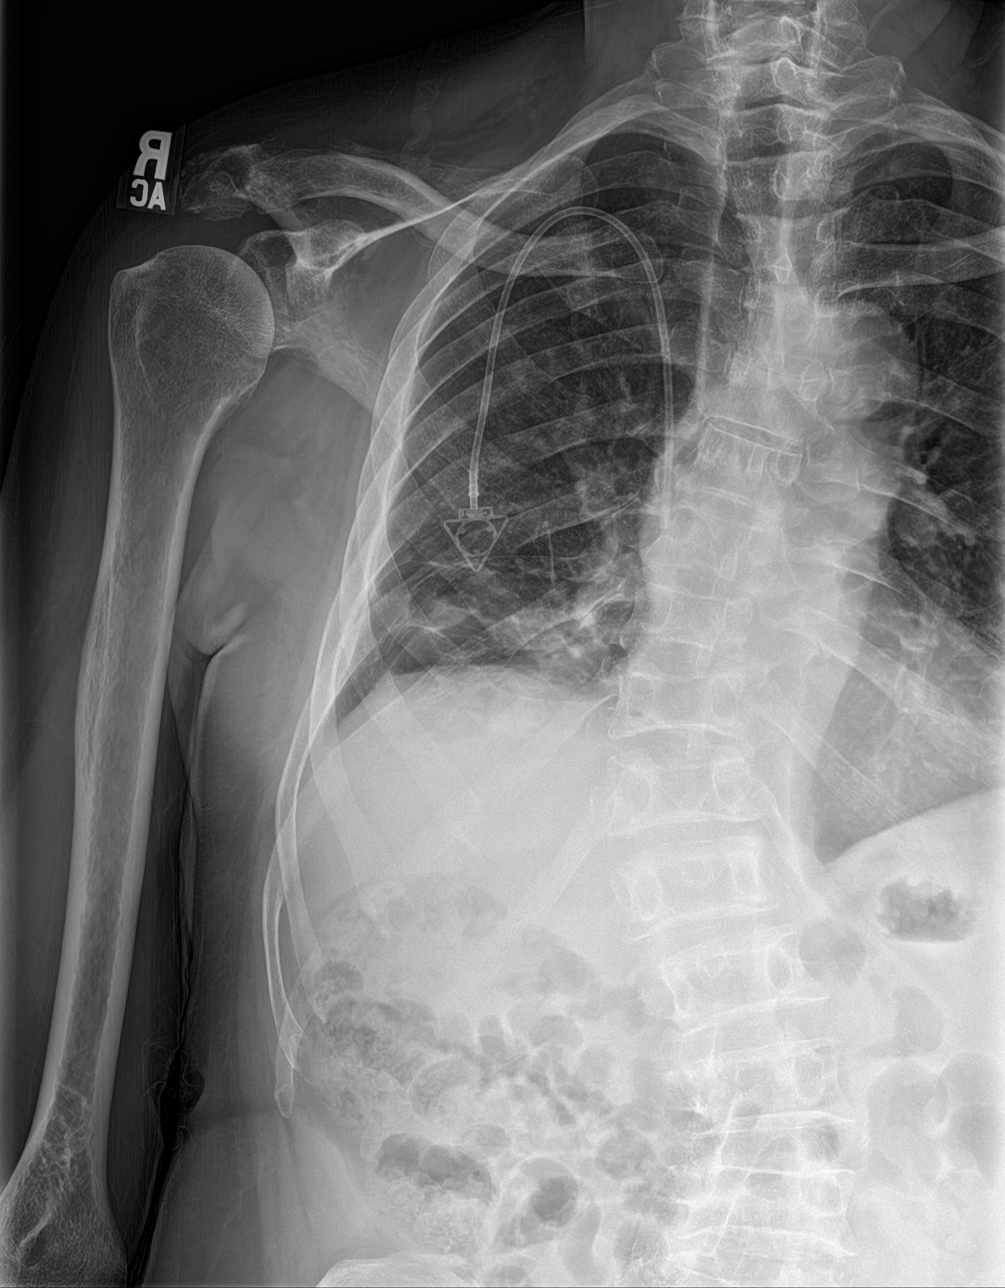

[rib pa obl]
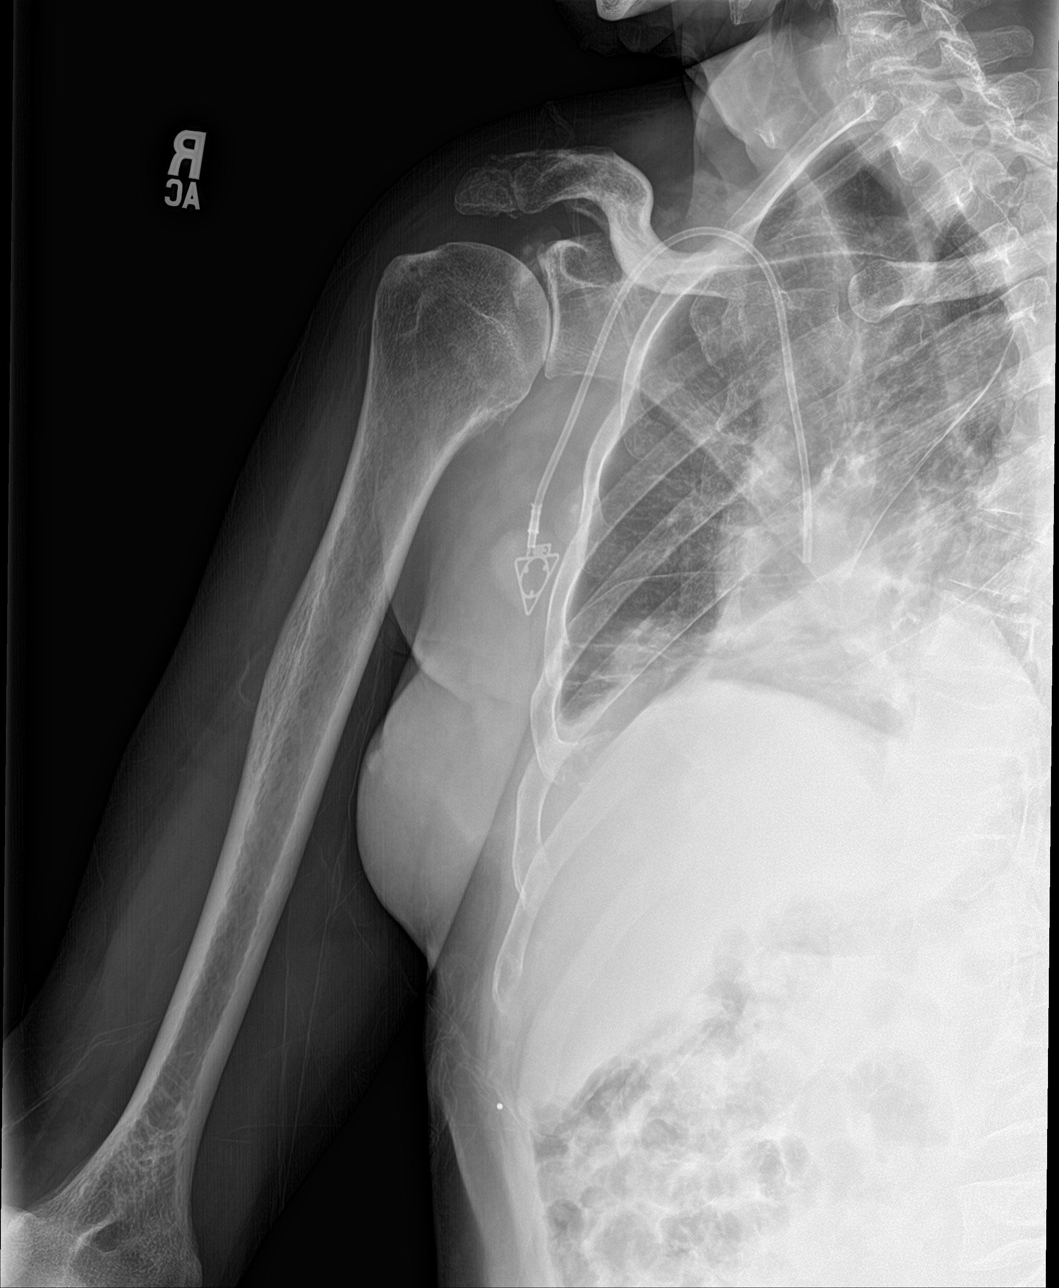

[3 of 3 positions shown; findings below may reference images not displayed]

FINDINGS: Right rib images are negative for displaced fracture. There is
marked thoracolumbar scoliosis. Mild loss of height is present in
several of the lower thoracic vertebrae with biconcave deformities.
There is mild patchy and linear opacity in the right base which
could represent scarring, atelectasis or consolidation. Lung base
mass cannot be excluded. The left lung is clear. No large effusions.

There is a right jugular Port-A-Cath with tip at the expected
location of the low SVC.

No prior studies are available this time.
IMPRESSION: Patchy opacities in the right lung base. Cannot exclude infectious
infiltrate.

No displaced fracture.
